# Patient Record
Sex: Female | Born: 1970 | Race: White | Hispanic: No | Marital: Married | Smoking: Never smoker
Health system: Southern US, Community
[De-identification: ages and names within clinical notes are randomized; demographics above are authoritative.]

---

## 2000-07-01 ENCOUNTER — Other Ambulatory Visit: Admission: RE | Admit: 2000-07-01 | Discharge: 2000-07-01 | Payer: Self-pay | Admitting: Obstetrics and Gynecology

## 2001-03-13 ENCOUNTER — Other Ambulatory Visit: Admission: RE | Admit: 2001-03-13 | Discharge: 2001-03-13 | Payer: Self-pay | Admitting: Obstetrics and Gynecology

## 2001-10-15 ENCOUNTER — Inpatient Hospital Stay (HOSPITAL_COMMUNITY): Admission: AD | Admit: 2001-10-15 | Discharge: 2001-10-17 | Payer: Self-pay | Admitting: Obstetrics and Gynecology

## 2001-11-15 ENCOUNTER — Other Ambulatory Visit: Admission: RE | Admit: 2001-11-15 | Discharge: 2001-11-15 | Payer: Self-pay | Admitting: Obstetrics and Gynecology

## 2003-04-01 ENCOUNTER — Other Ambulatory Visit: Admission: RE | Admit: 2003-04-01 | Discharge: 2003-04-01 | Payer: Self-pay | Admitting: Obstetrics and Gynecology

## 2004-05-08 ENCOUNTER — Other Ambulatory Visit: Admission: RE | Admit: 2004-05-08 | Discharge: 2004-05-08 | Payer: Self-pay | Admitting: Obstetrics and Gynecology

## 2005-03-31 ENCOUNTER — Inpatient Hospital Stay (HOSPITAL_COMMUNITY): Admission: RE | Admit: 2005-03-31 | Discharge: 2005-04-02 | Payer: Self-pay | Admitting: Obstetrics and Gynecology

## 2005-05-12 ENCOUNTER — Other Ambulatory Visit: Admission: RE | Admit: 2005-05-12 | Discharge: 2005-05-12 | Payer: Self-pay | Admitting: Obstetrics and Gynecology

## 2011-07-10 ENCOUNTER — Encounter (HOSPITAL_COMMUNITY): Payer: Self-pay | Admitting: Emergency Medicine

## 2011-07-10 ENCOUNTER — Emergency Department (HOSPITAL_COMMUNITY)
Admission: EM | Admit: 2011-07-10 | Discharge: 2011-07-10 | Disposition: A | Discharge status: Home / Self Care | Payer: BC Managed Care – PPO | Attending: Emergency Medicine | Admitting: Emergency Medicine

## 2011-07-10 ENCOUNTER — Emergency Department (HOSPITAL_COMMUNITY): Payer: BC Managed Care – PPO

## 2011-07-10 DIAGNOSIS — S022XXA Fracture of nasal bones, initial encounter for closed fracture: Secondary | ICD-10-CM | POA: Insufficient documentation

## 2011-07-10 DIAGNOSIS — IMO0002 Reserved for concepts with insufficient information to code with codable children: Secondary | ICD-10-CM | POA: Insufficient documentation

## 2011-07-10 NOTE — Discharge Instructions (Signed)
Nasal Fracture  A nasal fracture is a break or crack in the bones of the nose. A minor break usually heals in a month. You often will receive black eyes from a nasal fracture. This is not a cause for concern. The black eyes will go away over 1 to 2 weeks.   DIAGNOSIS   Your caregiver may want to examine you if you are concerned about a fracture of the nose. X-rays of the nose may not show a nasal fracture even when one is present. Sometimes your caregiver must wait 1 to 5 days after the injury to re-check the nose for alignment and to take additional X-rays. Sometimes the caregiver must wait until the swelling has gone down.  TREATMENT  Minor fractures that have caused no deformity often do not require treatment. More serious fractures where bones are displaced may require surgery. This will take place after the swelling is gone. Surgery will stabilize and align the fracture.  HOME CARE INSTRUCTIONS    Put ice on the injured area.   Put ice in a plastic bag.   Place a towel between your skin and the bag.   Leave the ice on for 15 to 20 minutes, 3 to 4 times a day.   Take medications as directed by your caregiver.   Only take over-the-counter or prescription medicines for pain, discomfort, or fever as directed by your caregiver.   If your nose starts bleeding, squeeze the soft parts of the nose against the center wall while you are sitting in an upright position for 10 minutes.   Contact sports should be avoided for at least 3 to 4 weeks or as directed by your caregiver.  SEEK MEDICAL CARE IF:   Your pain increases or becomes severe.   You continue to have nosebleeds.   The shape of your nose does not return to normal within 5 days.   You have pus draining from the nose.  SEEK IMMEDIATE MEDICAL CARE IF:    You have bleeding from your nose that does not stop after 20 minutes of pinching the nostrils closed and keeping ice on the nose.   You have clear fluid draining from your nose.   You notice a  grape-like swelling on the dividing wall between the nostrils (septum). This is a collection of blood (hematoma) that must be drained to help prevent infection.   You have difficulty moving your eyes.   You have recurrent vomiting.  Document Released: 04/16/2000 Document Revised: 04/08/2011 Document Reviewed: 08/03/2010  [employer] Patient Information 2012 [employer].

## 2011-07-10 NOTE — ED Notes (Signed)
Pt. Stated, i was coming out the back door and as i was opening up the door my dog came flying in and popped me right in my mid face.  i think I broke my nose.

## 2011-07-10 NOTE — ED Provider Notes (Signed)
History     CSN: [id number]  Arrival date & time 07/10/11  1024   First MD Initiated Contact with Patient 07/10/11 1126      Chief Complaint  Patient presents with  . Epistaxis     HPI The patient presents with nose pain and epistaxis.  2 hours prior to arrival the patient was struck by her dog in the face.  She immediately had epistaxis and the onset of sharp pain.  The pain has been persistent, mild, sharp.  No OTC medication use thus far.  The epistaxis stopped after about 5 minutes.  No LOC, no visual changes, no other subsequent pain, no malocclusion, no TMJ pain. History reviewed. No pertinent past medical history.  No past surgical history on file.  No family history on file.  History  Substance Use Topics  . Smoking status: Not on file  . Smokeless tobacco: Not on file  . Alcohol Use: No    OB History    Grav Para Term Preterm Abortions TAB SAB Ect Mult Living                  Review of Systems  All other systems reviewed and are negative.    Allergies  Review of patient's allergies indicates no known allergies.  Home Medications  No current outpatient prescriptions on file.  BP 110/65  Pulse 91  Temp(Src) 98.4 F (36.9 C) (Oral)  Resp 20  SpO2 97%  Physical Exam  Nursing note and vitals reviewed. Constitutional: She is oriented to person, place, and time. She appears well-developed and well-nourished. No distress.  HENT:  Head: Normocephalic and atraumatic. Head is without raccoon's eyes, without Battle's sign, without contusion, without laceration, without right periorbital erythema and without left periorbital erythema.  Nose: Sinus tenderness and nasal deformity present. No mucosal edema, rhinorrhea, nose lacerations, septal deviation or nasal septal hematoma. No epistaxis. Right sinus exhibits no maxillary sinus tenderness and no frontal sinus tenderness. Left sinus exhibits no maxillary sinus tenderness and no frontal sinus tenderness.     Mouth/Throat: Oropharynx is clear and moist and mucous membranes are normal. No oropharyngeal exudate, posterior oropharyngeal edema or posterior oropharyngeal erythema.       No visible posterior bleeding or hematoma.  No septal hematoma  Eyes: Conjunctivae and EOM are normal.  Cardiovascular: Normal rate and regular rhythm.   Pulmonary/Chest: Effort normal and breath sounds normal. No stridor. No respiratory distress.  Abdominal: She exhibits no distension.  Musculoskeletal: She exhibits no edema.  Neurological: She is alert and oriented to person, place, and time. No cranial nerve deficit.  Skin: Skin is warm and dry.  Psychiatric: She has a normal mood and affect.    ED Course  Procedures (including critical care time)  Labs Reviewed - No data to display No results found.   No diagnosis found.  X-ray reviewed by me  MDM  This generally well female presents with new, following minor trauma.  On exam she is in no distress, there is no septal hematoma, nor any evidence of ongoing epistaxis.  Patient's x-ray demonstrates a minimally displaced nasal fracture.  Patient discharged in stable condition following a discussion of analgesics.  She'll follow up with ENT in one week    Gerhard Munch, MD 07/10/11 1251

## 2013-04-04 ENCOUNTER — Encounter (HOSPITAL_COMMUNITY): Payer: Self-pay | Admitting: Emergency Medicine

## 2013-04-04 ENCOUNTER — Emergency Department (HOSPITAL_COMMUNITY)
Admission: EM | Admit: 2013-04-04 | Discharge: 2013-04-04 | Disposition: A | Discharge status: Home / Self Care | Payer: BC Managed Care – PPO | Attending: Emergency Medicine | Admitting: Emergency Medicine

## 2013-04-04 DIAGNOSIS — R002 Palpitations: Secondary | ICD-10-CM | POA: Insufficient documentation

## 2013-04-04 DIAGNOSIS — R61 Generalized hyperhidrosis: Secondary | ICD-10-CM | POA: Insufficient documentation

## 2013-04-04 DIAGNOSIS — F419 Anxiety disorder, unspecified: Secondary | ICD-10-CM

## 2013-04-04 DIAGNOSIS — Z79899 Other long term (current) drug therapy: Secondary | ICD-10-CM | POA: Insufficient documentation

## 2013-04-04 DIAGNOSIS — F411 Generalized anxiety disorder: Secondary | ICD-10-CM | POA: Insufficient documentation

## 2013-04-04 DIAGNOSIS — Z9889 Other specified postprocedural states: Secondary | ICD-10-CM | POA: Insufficient documentation

## 2013-04-04 LAB — CBC WITH DIFFERENTIAL/PLATELET
Basophils Absolute: 0 10*3/uL (ref 0.0–0.1)
Lymphocytes Relative: 13 % (ref 12–46)
Lymphs Abs: 1 10*3/uL (ref 0.7–4.0)
Neutro Abs: 6.5 10*3/uL (ref 1.7–7.7)
Neutrophils Relative %: 81 % — ABNORMAL HIGH (ref 43–77)
Platelets: 236 10*3/uL (ref 150–400)
RBC: 4.54 MIL/uL (ref 3.87–5.11)
RDW: 12.9 % (ref 11.5–15.5)
WBC: 8 10*3/uL (ref 4.0–10.5)

## 2013-04-04 LAB — COMPREHENSIVE METABOLIC PANEL
Albumin: 4.7 g/dL (ref 3.5–5.2)
Alkaline Phosphatase: 79 U/L (ref 39–117)
BUN: 10 mg/dL (ref 6–23)
Chloride: 98 mEq/L (ref 96–112)
Creatinine, Ser: 0.82 mg/dL (ref 0.50–1.10)
GFR calc Af Amer: >90 mL/min (ref >90)
Glucose, Bld: 99 mg/dL (ref 70–99)
Potassium: 3.8 mEq/L (ref 3.5–5.1)
Total Bilirubin: 1.4 mg/dL — ABNORMAL HIGH (ref 0.3–1.2)

## 2013-04-04 MED ORDER — LORAZEPAM 0.5 MG PO TABS: 0.5000 mg | ORAL_TABLET | Freq: Three times a day (TID) | ORAL | Status: AC | PRN

## 2013-04-04 NOTE — ED Notes (Signed)
C/o increased anxiety x 3 weeks & had a panic attack a week ago while on vacation. States had palpitations, chest tightness, breathing fast. States episode lasted <30 min.  States since then anxiety has continued. Also has noted frequent hot flashes, 3-4 daily x 2 months. Saw OB/GYN yesterday, currently being worked up for menopause, had labs drawn & prescribed paxil. Also reports had an episode of vertigo approx 3 weeks ago. Denies any extra stress in life. Denies any CP during panic attack. States had them as a [employer]en adult.

## 2013-04-04 NOTE — ED Provider Notes (Signed)
CSN: [id number]     Arrival date & time 04/04/13  1058 History   None    Chief Complaint  Patient presents with  . Panic Attack   HPI 42 year old woman with no significant PMH presents with anxiety x 3 weeks.  Patient states that 2 months ago she began having episodes of diaphoresis with palpitations. Approximately 3 weeks ago, she had an episode of vertigo that resolved within several hours. However, since that time, she has had great anxiety over having further episodes of vertigo while driving her children to school etc. She states that when she is busy during the day, she does not have anxiety but at night when she has time to think about things, she finds herself anxious and ruminating over her having recurrent vertigo with consequences.    Patient states that she may be menopausal or perimenopausal and having hot flashes as her mother went through menopause in her early 24s. She had a procedure several years ago for fibroids and has not had menstrual periods since that time. She saw her gynecologist yesterday at which time he initiated Paxil therapy as well as check a TSH and FSH; results are pending.  She was still feeling anxious today though so decided to come to ED for evaluation to put her mind at ease.  History reviewed. No pertinent past medical history. Past Surgical History  Procedure Laterality Date  . Cesarean section     No family history on file. History  Substance Use Topics  . Smoking status: Never Smoker   . Smokeless tobacco: Not on file  . Alcohol Use: Yes   OB History   Grav Para Term Preterm Abortions TAB SAB Ect Mult Living                 Review of Systems  Constitutional: Negative for fever, appetite change, fatigue and unexpected weight change.  HENT: Negative for congestion.   Respiratory: Negative for cough, chest tightness and shortness of breath.   Cardiovascular: Positive for palpitations. Negative for chest pain and leg swelling.  Genitourinary:  Negative for dysuria, frequency, hematuria, vaginal bleeding and pelvic pain.  Musculoskeletal: Negative for arthralgias, joint swelling and neck stiffness.  Neurological: Negative for dizziness, syncope and weakness.  Psychiatric/Behavioral: The patient is nervous/anxious.     Allergies  Review of patient's allergies indicates no known allergies.  Home Medications   Current Outpatient Rx  Name  Route  Sig  Dispense  Refill  . ibuprofen (ADVIL,MOTRIN) 200 MG tablet   Oral   Take 600 mg by mouth every 6 (six) hours as needed for headache.         Marland Kitchen PARoxetine (PAXIL) 10 MG tablet   Oral   Take 10 mg by mouth daily.         Marland Kitchen LORazepam (ATIVAN) 0.5 MG tablet   Oral   Take 1 tablet (0.5 mg total) by mouth every 8 (eight) hours as needed for anxiety.   20 tablet   0    BP 133/77  Pulse 88  Resp 17  SpO2 99% Physical Exam  Constitutional: She is oriented to person, place, and time. She appears well-developed and well-nourished. No distress.  HENT:  Head: Normocephalic and atraumatic.  Right Ear: External ear normal.  Left Ear: External ear normal.  Eyes: Conjunctivae and EOM are normal. Pupils are equal, round, and reactive to light.  Neck: Normal range of motion. Neck supple.  Cardiovascular: Normal rate, regular rhythm and normal  heart sounds.  Exam reveals no gallop and no friction rub.   No murmur heard. Pulmonary/Chest: Effort normal and breath sounds normal. She has no wheezes. She has no rales.  Abdominal: Soft. Bowel sounds are normal. She exhibits no distension. There is no tenderness.  Neurological: She is alert and oriented to person, place, and time. No cranial nerve deficit.  Skin: Skin is warm and dry. She is not diaphoretic.  Psychiatric: Her mood appears anxious.    ED Course  Procedures (including critical care time) Labs Review Labs Reviewed  COMPREHENSIVE METABOLIC PANEL - Abnormal; Notable for the following:    Total Protein 8.4 (*)    Total  Bilirubin 1.4 (*)    GFR calc non Af Amer 86 (*)    All other components within normal limits  CBC WITH DIFFERENTIAL - Abnormal; Notable for the following:    Neutrophils Relative % 81 (*)    All other components within normal limits  CBC WITH DIFFERENTIAL    EKG Interpretation    Date/Time:  Wednesday April 04 2013 11:23:19 EST Ventricular Rate:  110 PR Interval:  126 QRS Duration: 86 QT Interval:  334 QTC Calculation: 452 R Axis:   90 Text Interpretation:  Sinus tachycardia Rightward axis Borderline ECG Sinus tachycardia Rightward axis Abnormal ekg Confirmed by Gerhard Munch  MD [phone]) on 04/04/2013 1:02:31 PM            MDM  Anxiety- patient has anxiety at baseline, stating that she "has always been a worrier." She may be having panic attacks versus menopausal symptoms. Patient's gynecologist ordered TSH, [facility] appointment yesterday (pending) as well as initiated Paxil for anxiety. BMP, CBC within normal limits. EKG with sinus tachycardia, no arrhythmias or ST/T-wave changes.  Discharged patient home with prescription for lorazepam 0.5 mg PO #10 prn anxiety.  Explained that Paxil takes weeks to work so she can use lorazepam as needed for anxiety in interim.   Rocco Serene, MD 04/04/13 [phone]

## 2013-04-04 NOTE — ED Provider Notes (Signed)
This patient was seen with the Resident Physician, Dr. Aundria Rud. The documentation is accurate and reflects the evaluation here.  On my evaluation, the patient was in no distress, resting comfortably.  We had a lengthy conversation about anxiety, vertigo.  We also discussed the need for a trial of medication, including the Paxil which she was provided yesterday.  She was discharged in stable condition.  Gerhard Munch, MD 04/04/13 [phone]

## 2013-04-04 NOTE — ED Notes (Addendum)
Anxiety for 3 weeks had a panic attack on vacation  Has been having hot flashes just started paxil yesterday had palpitations when it started and sweats and vertigo started  First week of nov and wants her ears checked

## 2013-04-13 ENCOUNTER — Ambulatory Visit: Payer: BC Managed Care – PPO | Admitting: Cardiology

## 2013-05-25 IMAGING — CR DG NASAL BONES 3+V
3 series · 3 of 3 positions shown · non-contrast
Comparison: None

CLINICAL DATA: Nasal injury and pain.

NASAL BONES - 3+ VIEW

[w waters]
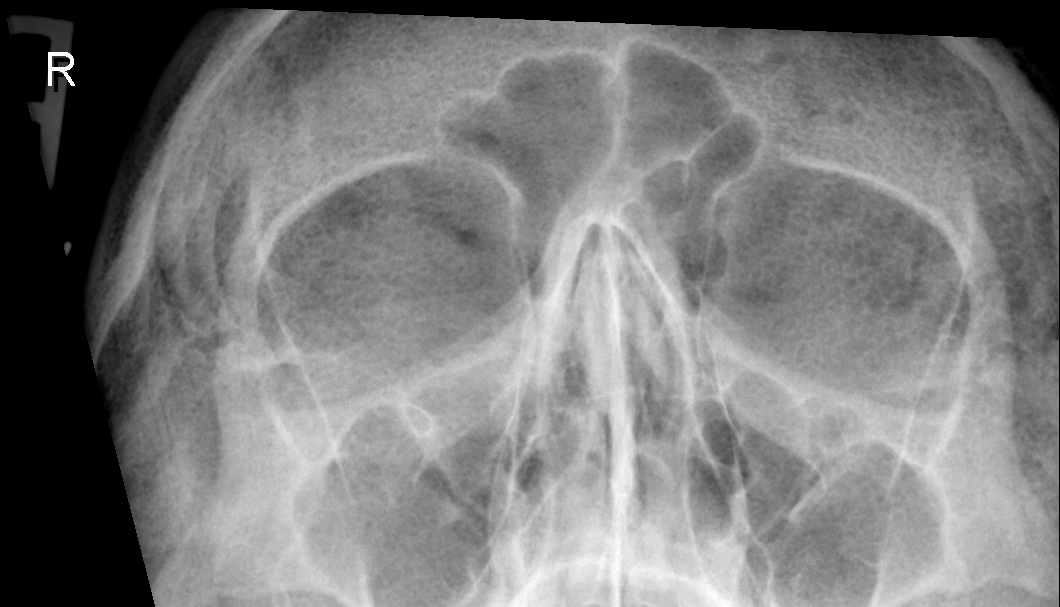

[w nasal bone lat * (1 of 2)]
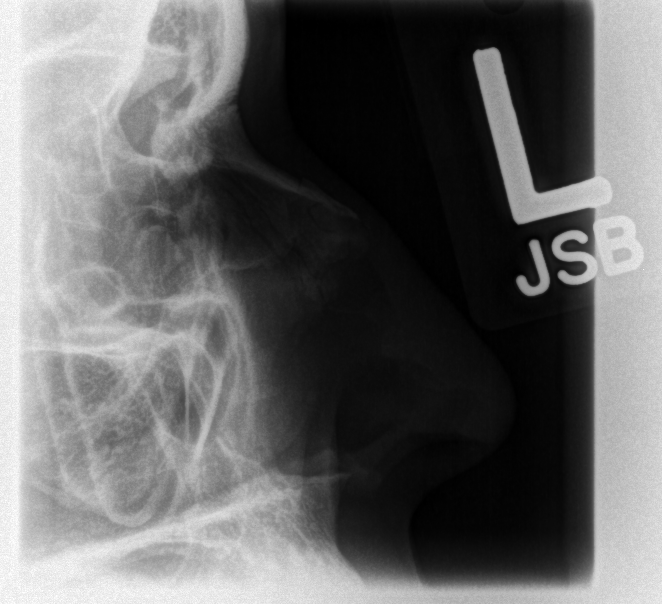

[w nasal bone lat * (2 of 2)]
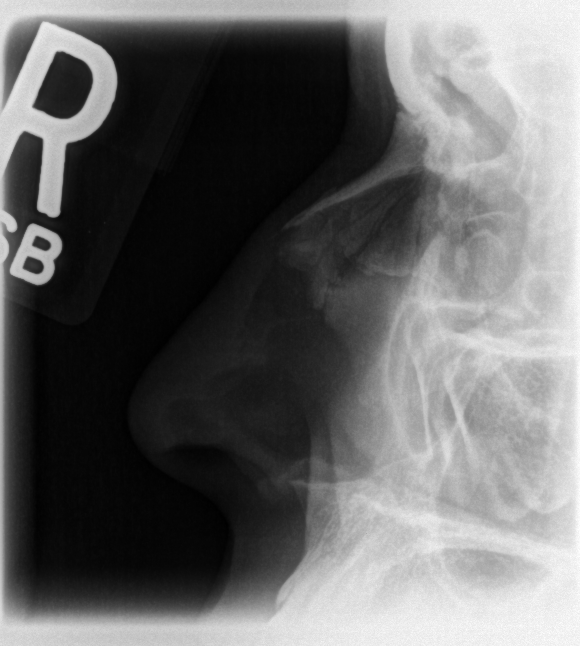

[3 of 3 positions shown; findings below may reference images not displayed]

FINDINGS: A minimally-displaced nasal bone fracture is noted. No
other bony abnormalities are present.
No radiopaque foreign bodies are identified.
IMPRESSION: Minimally displaced nasal bone fracture.

## 2013-12-08 ENCOUNTER — Emergency Department (INDEPENDENT_AMBULATORY_CARE_PROVIDER_SITE_OTHER)
Admission: EM | Admit: 2013-12-08 | Discharge: 2013-12-08 | Disposition: A | Discharge status: Home / Self Care | Payer: Managed Care, Other (non HMO) | Source: Home / Self Care

## 2013-12-08 ENCOUNTER — Encounter (HOSPITAL_COMMUNITY): Payer: Self-pay | Admitting: Emergency Medicine

## 2013-12-08 DIAGNOSIS — B029 Zoster without complications: Secondary | ICD-10-CM

## 2013-12-08 MED ORDER — LIDOCAINE 5 % EX PTCH: 1.0000 | MEDICATED_PATCH | CUTANEOUS | Status: AC

## 2013-12-08 MED ORDER — OXYCODONE-ACETAMINOPHEN 5-325 MG PO TABS: 1.0000 | ORAL_TABLET | ORAL | Status: AC | PRN

## 2013-12-08 MED ORDER — VALACYCLOVIR HCL 1 G PO TABS: 1000.0000 mg | ORAL_TABLET | Freq: Three times a day (TID) | ORAL | Status: AC

## 2013-12-08 NOTE — ED Provider Notes (Signed)
CSN: [id number]     Arrival date & time 12/08/13  0945 History   None    Chief Complaint  Patient presents with  . Rash   (Consider location/radiation/quality/duration/timing/severity/associated sxs/prior Treatment)  HPI  Patient is a 43 year old female with reports of onset of a painful rash diagnosed yesterday at an urgent care center [address] as "shingles". Patient states she is concerned because it is exceptionally painful and was not confident in the diagnosis of the physician she saw yesterday as she states he felt "it might be contact dermatitis". In addition, patient states the pain in her right occipital area is severe and has "never felt pain like this before".  The patient was given 12 Vicodin for her discomfort yesterday. Patient states she has taken 3, but feels that it is not adequately controlling the discomfort. Patient states onset of discomfort was approximately 2 days ago with a "sunburn" type of feeling along her right neck, shoulder and back area.  History reviewed. No pertinent past medical history. Past Surgical History  Procedure Laterality Date  . Cesarean section     History reviewed. No pertinent family history. History  Substance Use Topics  . Smoking status: Never Smoker   . Smokeless tobacco: Not on file  . Alcohol Use: Yes   OB History   Grav Para Term Preterm Abortions TAB SAB Ect Mult Living                 Review of Systems  Constitutional: Positive for fatigue. Negative for fever and chills.  HENT: Negative for sore throat and voice change.   Eyes: Negative.  Negative for photophobia, pain, discharge, redness, itching and visual disturbance.  Respiratory: Negative.  Negative for cough, chest tightness, shortness of breath and wheezing.   Cardiovascular: Negative.  Negative for chest pain, palpitations and leg swelling.  Gastrointestinal: Positive for nausea. Negative for vomiting, abdominal pain, diarrhea and constipation.  Endocrine:  Negative.   Genitourinary: Negative.   Musculoskeletal: Negative.   Skin: Positive for rash.  Allergic/Immunologic: Negative.   Neurological: Positive for headaches. Negative for dizziness, tremors, seizures, syncope, facial asymmetry, speech difficulty, weakness, light-headedness and numbness.  Hematological: Negative.   Psychiatric/Behavioral: Negative.     Allergies  Review of patient's allergies indicates no known allergies.  Home Medications   Prior to Admission medications   Medication Sig Start Date End Date Taking? Authorizing Provider  HYDROcodone-acetaminophen (NORCO/VICODIN) 5-325 MG per tablet Take 1 tablet by mouth every 6 (six) hours as needed for moderate pain.   Yes Historical Provider, MD  ValACYclovir HCl (VALTREX PO) Take by mouth.   Yes Historical Provider, MD  ibuprofen (ADVIL,MOTRIN) 200 MG tablet Take 600 mg by mouth every 6 (six) hours as needed for headache.    Historical Provider, MD  lidocaine (LIDODERM) 5 % Place 1 patch onto the skin daily. Remove & Discard patch within 12 hours or as directed by MD 12/08/13   Servando Salina, NP  LORazepam (ATIVAN) 0.5 MG tablet Take 1 tablet (0.5 mg total) by mouth every 8 (eight) hours as needed for anxiety. 04/04/13   Rocco Serene, MD  oxyCODONE-acetaminophen (PERCOCET/ROXICET) 5-325 MG per tablet Take 1-2 tablets by mouth every 4 (four) hours as needed for moderate pain or severe pain. 12/08/13   Servando Salina, NP  PARoxetine (PAXIL) 10 MG tablet Take 10 mg by mouth daily.    Historical Provider, MD  valACYclovir (VALTREX) 1000 MG tablet Take 1 tablet (1,000 mg total) by mouth 3 (  three) times daily. 12/08/13   Servando Salinaatherine H Shonice Wrisley, NP   BP 131/69  Pulse 94  Temp(Src) 98.6 F (37 C)  Resp 16  SpO2 95%  Physical Exam  Nursing note and vitals reviewed. Constitutional: She is oriented to person, place, and time. She appears well-developed and well-nourished. No distress.  Neck: Normal range of motion. Neck supple. No  thyromegaly present.  Mild anterior cervical lymphadenopathy present bilaterally. Negative for nuchal rigidity.  Cardiovascular: Normal rate, regular rhythm, normal heart sounds and intact distal pulses.  Exam reveals no gallop and no friction rub.   No murmur heard. Pulmonary/Chest: Effort normal and breath sounds normal. No respiratory distress. She has no wheezes. She has no rales. She exhibits no tenderness.  Abdominal: Soft. Bowel sounds are normal. She exhibits no distension and no mass. There is no rebound and no guarding.  Neurological: She is alert and oriented to person, place, and time. She has normal reflexes.  Cranial nerves II through XII grossly intact. Negative Romberg. Heel/toe gait intact; no difficulty with ambulation noted.  Skin: Skin is warm, dry and intact. Rash noted. Rash is maculopapular. Rash is not pustular. She is not diaphoretic. No pallor.       ED Course  Procedures (including critical care time) Labs Review Labs Reviewed - No data to display  Imaging Review No results found.   MDM   1. Herpes zoster    Meds ordered this encounter  Medications  . HYDROcodone-acetaminophen (NORCO/VICODIN) 5-325 MG per tablet    Sig: Take 1 tablet by mouth every 6 (six) hours as needed for moderate pain.  . ValACYclovir HCl (VALTREX PO)    Sig: Take by mouth.  . valACYclovir (VALTREX) 1000 MG tablet    Sig: Take 1 tablet (1,000 mg total) by mouth 3 (three) times daily.    Dispense:  14 tablet    Refill:  0  . oxyCODONE-acetaminophen (PERCOCET/ROXICET) 5-325 MG per tablet    Sig: Take 1-2 tablets by mouth every 4 (four) hours as needed for moderate pain or severe pain.    Dispense:  30 tablet    Refill:  0  . lidocaine (LIDODERM) 5 %    Sig: Place 1 patch onto the skin daily. Remove & Discard patch within 12 hours or as directed by MD    Dispense:  15 patch    Refill:  0   Patient was only given a seven-day course of Valtrex for treatment of herpes zoster.  Patient given an additional 7 day prescription for full 14 day course. In addition patient given oxycodone and lidocaine patches for pain relief.  The patient verbalizes understanding and agrees to plan of care.       Servando Salinaatherine H Megen Madewell, NP 12/08/13 [phone]

## 2013-12-08 NOTE — ED Notes (Signed)
Rash   For  A  Few  Days   Neck  And  Upper  Back    - Getting  Worse  Red lesions   And  painfull  Surrounding  Area  -     Seen  Yesterday  At an  Urgent  Care  And  RX    Hydrocodone  And  Valtrex        - no  releif

## 2013-12-08 NOTE — Discharge Instructions (Signed)

## 2013-12-09 NOTE — ED Provider Notes (Signed)
Medical screening examination/treatment/procedure(s) were performed by a resident physician or non-physician practitioner and as the supervising physician I was immediately available for consultation/collaboration.  Kaoir Loree, MD    Cyerra Yim S Berlyn Malina, MD 12/09/13 0853 

## 2015-08-15 ENCOUNTER — Other Ambulatory Visit: Payer: Self-pay | Admitting: Obstetrics and Gynecology

## 2015-08-15 DIAGNOSIS — R928 Other abnormal and inconclusive findings on diagnostic imaging of breast: Secondary | ICD-10-CM

## 2015-08-19 ENCOUNTER — Ambulatory Visit
Admission: RE | Admit: 2015-08-19 | Discharge: 2015-08-19 | Disposition: A | Discharge status: Home / Self Care | Payer: BLUE CROSS/BLUE SHIELD | Source: Ambulatory Visit | Attending: Obstetrics and Gynecology | Admitting: Obstetrics and Gynecology

## 2015-08-19 DIAGNOSIS — R928 Other abnormal and inconclusive findings on diagnostic imaging of breast: Secondary | ICD-10-CM

## 2015-08-22 ENCOUNTER — Other Ambulatory Visit: Payer: Managed Care, Other (non HMO)

## 2019-06-30 ENCOUNTER — Ambulatory Visit: Payer: BLUE CROSS/BLUE SHIELD

## 2019-06-30 ENCOUNTER — Ambulatory Visit: Payer: BC Managed Care – PPO
